# Patient Record
Sex: Male | Born: 1965 | Race: White | Hispanic: No | Marital: Married
Health system: Southern US, Community
[De-identification: ages and names within clinical notes are randomized; demographics above are authoritative.]

---

## 2016-06-29 ENCOUNTER — Other Ambulatory Visit (HOSPITAL_COMMUNITY): Payer: Self-pay | Admitting: Surgery

## 2016-07-05 ENCOUNTER — Ambulatory Visit (HOSPITAL_COMMUNITY): Payer: Managed Care, Other (non HMO)

## 2016-08-09 ENCOUNTER — Ambulatory Visit (HOSPITAL_COMMUNITY)
Admission: RE | Admit: 2016-08-09 | Discharge: 2016-08-09 | Disposition: A | Discharge status: Home / Self Care | Payer: Managed Care, Other (non HMO) | Source: Ambulatory Visit | Attending: Surgery | Admitting: Surgery

## 2016-08-09 DIAGNOSIS — Z9884 Bariatric surgery status: Secondary | ICD-10-CM | POA: Diagnosis not present

## 2016-10-01 ENCOUNTER — Other Ambulatory Visit (HOSPITAL_COMMUNITY): Payer: Self-pay | Admitting: Surgery

## 2016-10-01 DIAGNOSIS — R635 Abnormal weight gain: Secondary | ICD-10-CM

## 2016-10-01 DIAGNOSIS — Z9884 Bariatric surgery status: Secondary | ICD-10-CM

## 2016-10-08 ENCOUNTER — Other Ambulatory Visit (HOSPITAL_COMMUNITY): Payer: Self-pay | Admitting: Surgery

## 2016-10-08 DIAGNOSIS — Y832 Surgical operation with anastomosis, bypass or graft as the cause of abnormal reaction of the patient, or of later complication, without mention of misadventure at the time of the procedure: Principal | ICD-10-CM

## 2016-10-08 DIAGNOSIS — K9189 Other postprocedural complications and disorders of digestive system: Secondary | ICD-10-CM

## 2016-10-08 DIAGNOSIS — Z9884 Bariatric surgery status: Secondary | ICD-10-CM

## 2016-10-11 ENCOUNTER — Ambulatory Visit (HOSPITAL_COMMUNITY)
Admission: RE | Admit: 2016-10-11 | Discharge: 2016-10-11 | Disposition: A | Discharge status: Home / Self Care | Payer: Managed Care, Other (non HMO) | Source: Ambulatory Visit | Attending: Surgery | Admitting: Surgery

## 2016-10-11 ENCOUNTER — Encounter: Payer: Managed Care, Other (non HMO) | Attending: Surgery | Admitting: Dietician

## 2016-10-11 ENCOUNTER — Other Ambulatory Visit: Payer: Self-pay

## 2016-10-11 DIAGNOSIS — K9189 Other postprocedural complications and disorders of digestive system: Secondary | ICD-10-CM | POA: Insufficient documentation

## 2016-10-11 DIAGNOSIS — E669 Obesity, unspecified: Secondary | ICD-10-CM | POA: Diagnosis present

## 2016-10-11 DIAGNOSIS — R635 Abnormal weight gain: Secondary | ICD-10-CM | POA: Diagnosis present

## 2016-10-11 DIAGNOSIS — Y832 Surgical operation with anastomosis, bypass or graft as the cause of abnormal reaction of the patient, or of later complication, without mention of misadventure at the time of the procedure: Secondary | ICD-10-CM | POA: Insufficient documentation

## 2016-10-11 NOTE — Progress Notes (Signed)
  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 Assessment and [facility] on 10/11/2016.   Preferred Learning Style:   No preference indicated   Learning Readiness:   Ready  Handouts given during visit include:  Pre-Op Goals Bariatric Surgery Protein Shakes  Samples provided and patient instructed on proper use: Premier protein shake (chocolate - qty 1) Lot#: [id number] Exp: 07/2017  Bariatric Advantage multivitamin (mixed fruit - qty 6) Lot#: [id number] Exp: 09/2017   During the appointment today the following Pre-Op Goals were reviewed with the patient: Maintain or lose weight as instructed by your surgeon Make healthy food choices Begin to limit portion sizes Limited concentrated sugars and fried foods Keep fat/sugar in the single digits per serving on   food labels Practice CHEWING your food  (aim for 30 chews per bite or until applesauce consistency) Practice not drinking 15 minutes before, during, and 30 minutes after each meal/snack Avoid all carbonated beverages  Avoid/limit caffeinated beverages  Avoid all sugar-sweetened beverages Consume 3 meals per day; eat every 3-5 hours Make a list of non-food related activities Aim for 64-100 ounces of FLUID daily  Aim for at least 60-80 grams of PROTEIN daily Look for a liquid protein source that contain ?15 g protein and ?5 g carbohydrate  (ex: shakes, drinks, shots)  Demonstrated degree of understanding via:  Teach Back  Teaching Method Utilized:  Visual Auditory Hands on  Barriers to learning/adherence to lifestyle change: none  [facility] to call [facility] to enroll in Pre-Op and Post-Op Nutrition Education when surgery date is  scheduled.

## 2016-10-12 ENCOUNTER — Encounter: Payer: Self-pay | Admitting: Dietician

## 2018-01-02 IMAGING — DX DG CHEST 2V
2 series · 2 of 2 positions shown · non-contrast
Comparison: None.

CLINICAL DATA: Preoperative exam for bariatric surgery.

EXAM:
CHEST  2 VIEW

[chest pa]
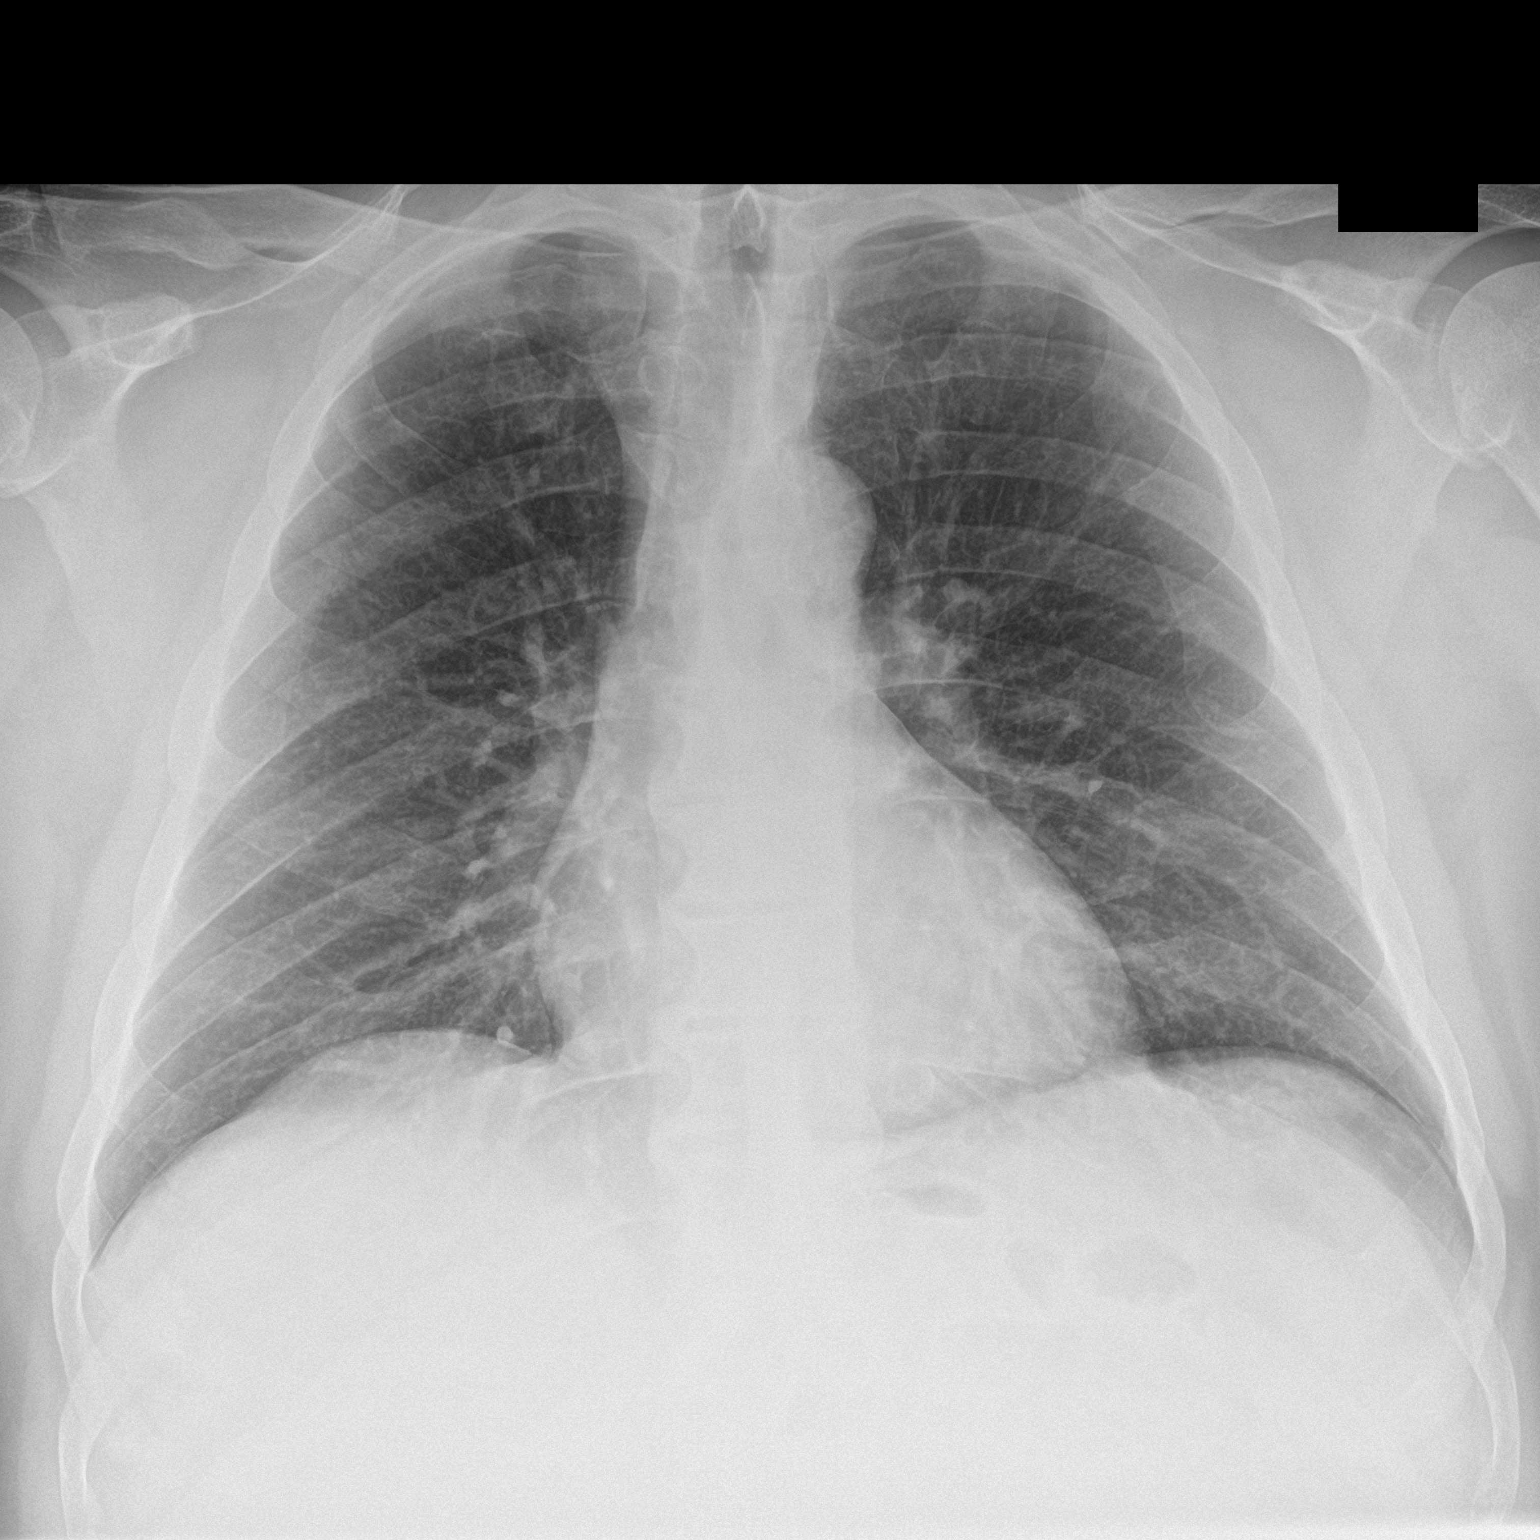

[chest lat]
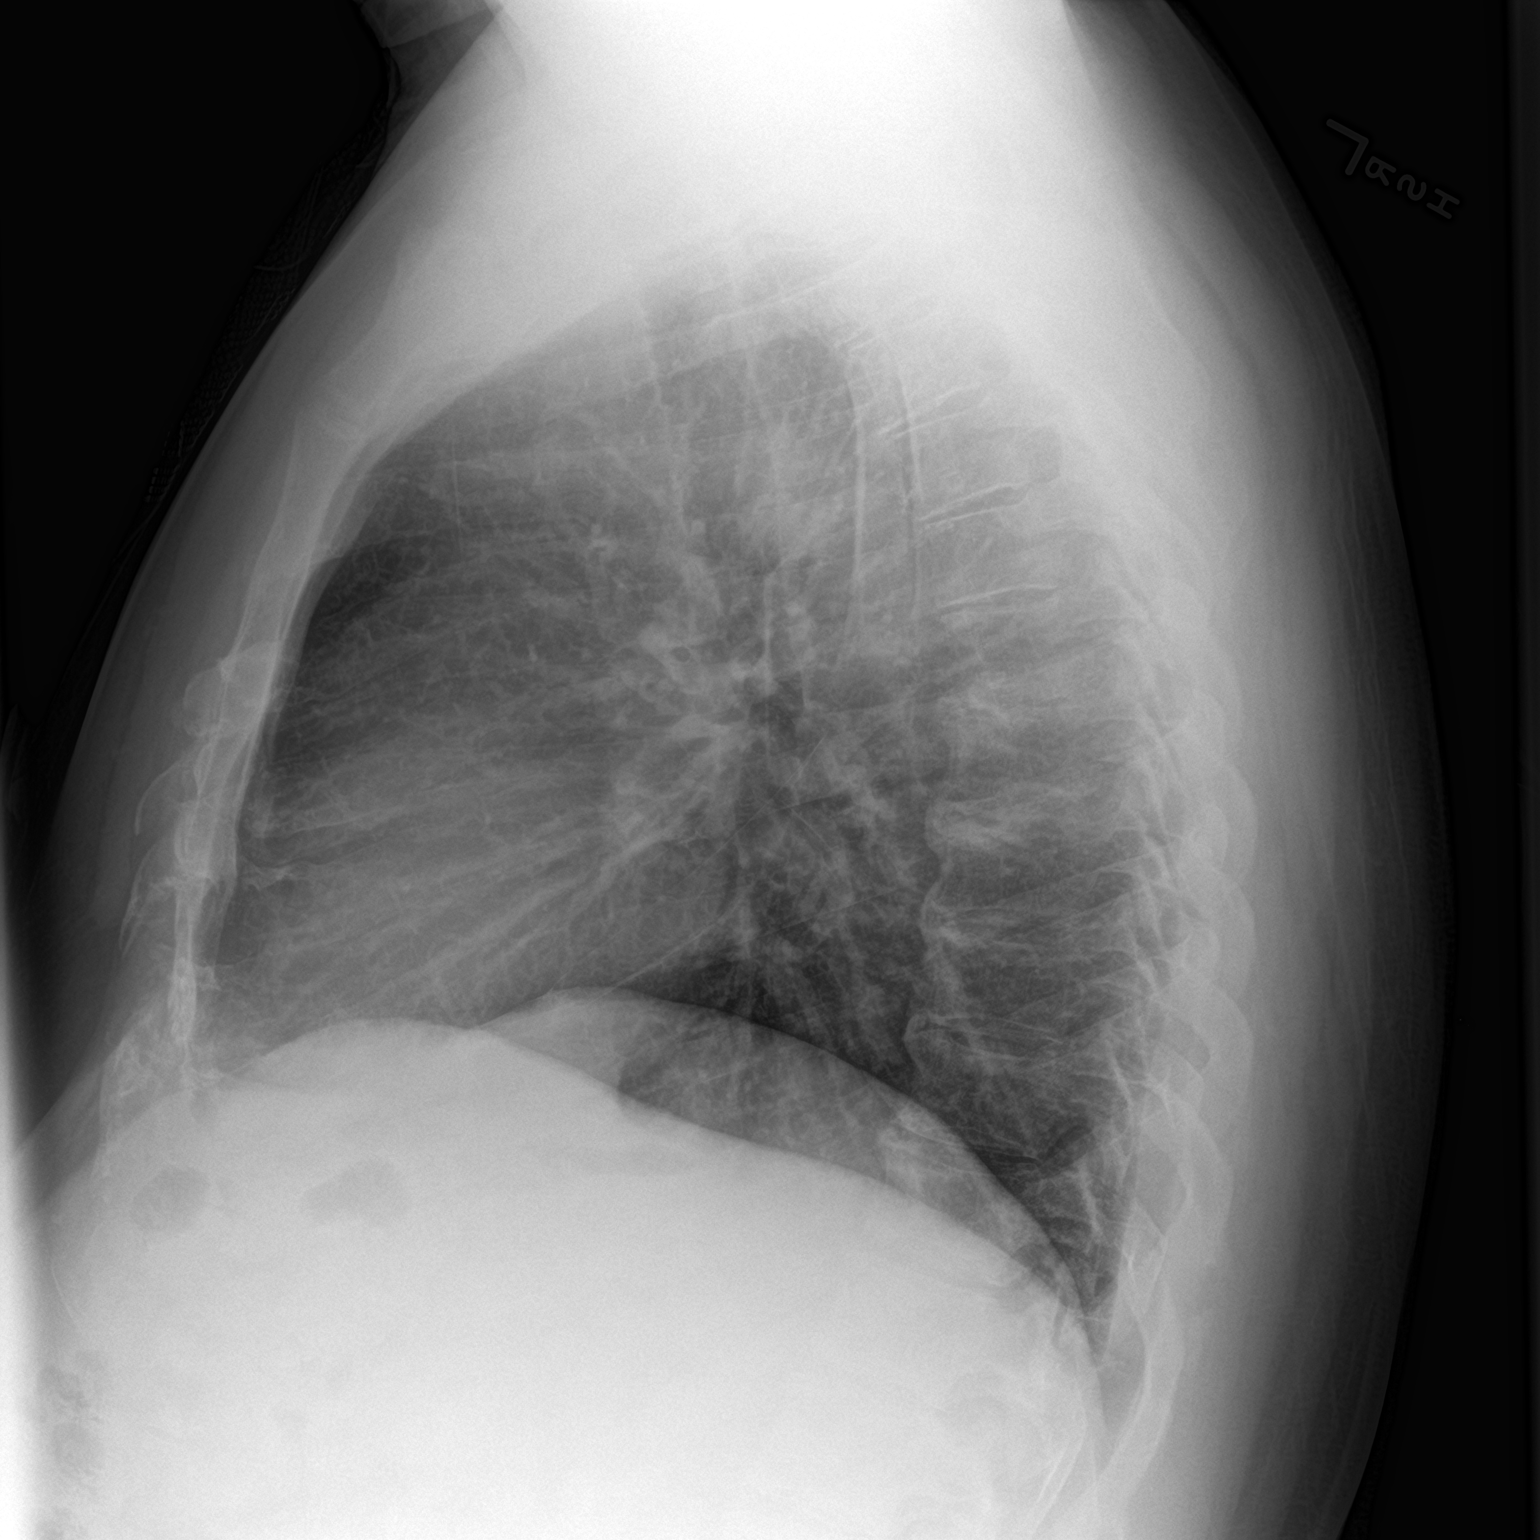

[2 of 2 positions shown; findings below may reference images not displayed]

FINDINGS: Heart size is normal. Mediastinal shadows are normal. The lungs are
clear. No bronchial thickening. No infiltrate, mass, effusion or
collapse. Pulmonary vascularity is normal. Ordinary degenerative
changes affect the spine.
IMPRESSION: No active cardiopulmonary disease.
